# Patient Record
Sex: Male | Born: 2000
Health system: Southern US, Community
[De-identification: ages and names within clinical notes are randomized; demographics above are authoritative.]

## PROBLEM LIST (undated history)

## (undated) DIAGNOSIS — J309 Allergic rhinitis, unspecified: Secondary | ICD-10-CM

## (undated) DIAGNOSIS — U071 COVID-19: Secondary | ICD-10-CM

## (undated) DIAGNOSIS — H8110 Benign paroxysmal vertigo, unspecified ear: Secondary | ICD-10-CM

## (undated) DIAGNOSIS — S62309A Unspecified fracture of unspecified metacarpal bone, initial encounter for closed fracture: Secondary | ICD-10-CM

## (undated) DIAGNOSIS — R42 Dizziness and giddiness: Secondary | ICD-10-CM

## (undated) DIAGNOSIS — R110 Nausea: Secondary | ICD-10-CM

## (undated) DIAGNOSIS — S01511A Laceration without foreign body of lip, initial encounter: Secondary | ICD-10-CM

## (undated) DIAGNOSIS — L709 Acne, unspecified: Secondary | ICD-10-CM

## (undated) DIAGNOSIS — B360 Pityriasis versicolor: Secondary | ICD-10-CM

## (undated) HISTORY — PX: FRACTURE SURGERY: SHX138

## (undated) HISTORY — PX: TIBIA FRACTURE SURGERY: SHX806

## (undated) HISTORY — DX: Allergic rhinitis, unspecified: J30.9

## (undated) HISTORY — PX: OTHER SURGICAL HISTORY: SHX169

## (undated) HISTORY — DX: Unspecified fracture of unspecified metacarpal bone, initial encounter for closed fracture: S62.309A

## (undated) HISTORY — DX: Dizziness and giddiness: R42

## (undated) HISTORY — DX: Nausea: R11.0

## (undated) HISTORY — DX: Laceration without foreign body of lip, initial encounter: S01.511A

## (undated) HISTORY — DX: Acne, unspecified: L70.9

## (undated) HISTORY — DX: Benign paroxysmal vertigo, unspecified ear: H81.10

## (undated) HISTORY — PX: ANTERIOR CRUCIATE LIGAMENT REPAIR: SHX115

## (undated) HISTORY — DX: Pityriasis versicolor: B36.0

## (undated) HISTORY — DX: COVID-19: U07.1

---
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
## 2020-04-29 ENCOUNTER — Encounter: Payer: Self-pay | Admitting: Internal Medicine

## 2020-10-13 ENCOUNTER — Telehealth: Payer: Self-pay | Admitting: *Deleted

## 2020-10-13 NOTE — Addendum Note (Signed)
Addended by: Quentin Ore on: 10/13/2020 05:27 PM   Modules accepted: Orders

## 2020-10-13 NOTE — Telephone Encounter (Signed)
Please place future orders for lab appt.  

## 2020-10-13 NOTE — Telephone Encounter (Signed)
He is too early for labs they are not due until 11/09/20  Please sch fasting labs 11/09/20 or after date so insurance will cover thanks

## 2020-10-16 ENCOUNTER — Other Ambulatory Visit (INDEPENDENT_AMBULATORY_CARE_PROVIDER_SITE_OTHER): Payer: BC Managed Care – PPO

## 2020-10-16 ENCOUNTER — Other Ambulatory Visit: Payer: Self-pay

## 2020-10-16 DIAGNOSIS — Z0184 Encounter for antibody response examination: Secondary | ICD-10-CM | POA: Diagnosis not present

## 2020-10-16 DIAGNOSIS — E559 Vitamin D deficiency, unspecified: Secondary | ICD-10-CM | POA: Diagnosis not present

## 2020-10-16 DIAGNOSIS — Z1389 Encounter for screening for other disorder: Secondary | ICD-10-CM

## 2020-10-16 DIAGNOSIS — Z0000 Encounter for general adult medical examination without abnormal findings: Secondary | ICD-10-CM

## 2020-10-16 DIAGNOSIS — Z1329 Encounter for screening for other suspected endocrine disorder: Secondary | ICD-10-CM

## 2020-10-16 LAB — CBC WITH DIFFERENTIAL/PLATELET
Basophils Absolute: 0.1 10*3/uL (ref 0.0–0.1)
Basophils Relative: 1.3 % (ref 0.0–3.0)
Eosinophils Absolute: 0.1 10*3/uL (ref 0.0–0.7)
Eosinophils Relative: 1.2 % (ref 0.0–5.0)
HCT: 41.9 % (ref 39.0–52.0)
Hemoglobin: 14.1 g/dL (ref 13.0–17.0)
Lymphocytes Relative: 66.2 % — ABNORMAL HIGH (ref 12.0–46.0)
Lymphs Abs: 2.7 10*3/uL (ref 0.7–4.0)
MCHC: 33.5 g/dL (ref 30.0–36.0)
MCV: 86.1 fl (ref 78.0–100.0)
Monocytes Absolute: 0.3 10*3/uL (ref 0.1–1.0)
Monocytes Relative: 6.3 % (ref 3.0–12.0)
Neutro Abs: 1 10*3/uL — ABNORMAL LOW (ref 1.4–7.7)
Neutrophils Relative %: 25 % — ABNORMAL LOW (ref 43.0–77.0)
Platelets: 230 10*3/uL (ref 150.0–400.0)
RBC: 4.87 Mil/uL (ref 4.22–5.81)
RDW: 13.2 % (ref 11.5–14.6)
WBC: 4.1 10*3/uL — ABNORMAL LOW (ref 4.5–10.5)

## 2020-10-16 LAB — TSH: TSH: 2.86 u[IU]/mL (ref 0.35–5.50)

## 2020-10-16 LAB — VITAMIN D 25 HYDROXY (VIT D DEFICIENCY, FRACTURES): VITD: 21.3 ng/mL — ABNORMAL LOW (ref 30.00–100.00)

## 2020-10-16 NOTE — Telephone Encounter (Signed)
This is why we need to have labs on day of appt with me to be sure of this or ensure patients are not calling and scheduling their own labs not sure if insurance will cover though I hope they do see prior message please

## 2020-10-16 NOTE — Telephone Encounter (Signed)
Noted labs were last done 11/2019. Since has been over 6 months labs should be covered per the lab

## 2020-10-16 NOTE — Telephone Encounter (Signed)
Pt came in this morning. Labs were drawn

## 2020-10-16 NOTE — Telephone Encounter (Signed)
No answer, no voicemail.   Patient needs to cancel lab appointment today and reschedule on or after 11/09/20

## 2020-10-17 ENCOUNTER — Encounter: Payer: Self-pay | Admitting: Internal Medicine

## 2020-10-17 DIAGNOSIS — D72819 Decreased white blood cell count, unspecified: Secondary | ICD-10-CM | POA: Insufficient documentation

## 2020-10-17 DIAGNOSIS — E559 Vitamin D deficiency, unspecified: Secondary | ICD-10-CM | POA: Insufficient documentation

## 2020-10-17 LAB — HEPATITIS B SURFACE ANTIBODY, QUANTITATIVE: Hep B S AB Quant (Post): 309 m[IU]/mL (ref >=10)

## 2020-10-17 LAB — URINALYSIS, ROUTINE W REFLEX MICROSCOPIC
Bilirubin Urine: NEGATIVE
Glucose, UA: NEGATIVE
Hgb urine dipstick: NEGATIVE
Ketones, ur: NEGATIVE
Leukocytes,Ua: NEGATIVE
Nitrite: NEGATIVE
Protein, ur: NEGATIVE
Specific Gravity, Urine: 1.026 (ref 1.001–1.035)
pH: 5.5 (ref 5.0–8.0)

## 2020-10-17 LAB — COMPREHENSIVE METABOLIC PANEL
ALT: 11 U/L (ref 0–53)
AST: 14 U/L (ref 0–37)
Albumin: 4.7 g/dL (ref 3.5–5.2)
Alkaline Phosphatase: 57 U/L (ref 39–117)
BUN: 16 mg/dL (ref 6–23)
CO2: 30 mEq/L (ref 19–32)
Calcium: 10.2 mg/dL (ref 8.4–10.5)
Chloride: 102 mEq/L (ref 96–112)
Creatinine, Ser: 1.19 mg/dL (ref 0.40–1.50)
GFR: 87.98 mL/min (ref >60.00)
Glucose, Bld: 87 mg/dL (ref 70–99)
Potassium: 4 mEq/L (ref 3.5–5.1)
Sodium: 141 mEq/L (ref 135–145)
Total Bilirubin: 0.9 mg/dL (ref 0.2–1.2)
Total Protein: 7.5 g/dL (ref 6.0–8.3)

## 2020-10-19 ENCOUNTER — Telehealth: Payer: Self-pay

## 2020-10-19 NOTE — Telephone Encounter (Signed)
Called to give labs. Unable to leave message

## 2020-10-20 ENCOUNTER — Ambulatory Visit (INDEPENDENT_AMBULATORY_CARE_PROVIDER_SITE_OTHER): Payer: BC Managed Care – PPO | Admitting: Internal Medicine

## 2020-10-20 ENCOUNTER — Encounter: Payer: Self-pay | Admitting: Internal Medicine

## 2020-10-20 ENCOUNTER — Other Ambulatory Visit: Payer: Self-pay

## 2020-10-20 VITALS — BP 122/78 | HR 54 | Temp 97.8°F | Resp 16 | Ht 71.0 in | Wt 171.8 lb

## 2020-10-20 DIAGNOSIS — B360 Pityriasis versicolor: Secondary | ICD-10-CM

## 2020-10-20 DIAGNOSIS — Z0000 Encounter for general adult medical examination without abnormal findings: Secondary | ICD-10-CM | POA: Diagnosis not present

## 2020-10-20 DIAGNOSIS — D72829 Elevated white blood cell count, unspecified: Secondary | ICD-10-CM

## 2020-10-20 MED ORDER — KETOCONAZOLE 2 % EX SHAM: 1.0000 "application " | MEDICATED_SHAMPOO | CUTANEOUS | 11 refills | Status: DC

## 2020-10-20 MED ORDER — KETOCONAZOLE 2 % EX CREA
1.0000 | TOPICAL_CREAM | Freq: Two times a day (BID) | CUTANEOUS | 11 refills | Status: DC | PRN
Start: 2020-10-20 — End: 2021-11-28

## 2020-10-20 NOTE — Patient Instructions (Addendum)
Results for LIEM, COPENHAVER (MRN [id number]) as of 10/20/2020 13:28  Ref. Range 10/16/2020 08:10  Hepatitis B-Post Latest Ref Range: > OR = 10 mIU/mL 309    Multivitamin with vitamin D and also take additional vitamin D3 2000 IU daily   Check again CBC in 1 month   Tdap vaccine due 12/18/2021 not yet but almost    Vitamin D Deficiency Vitamin D deficiency is when your body does not have enough vitamin D. Vitamin D is important to your body for many reasons: It helps the body absorb two important minerals--calcium and phosphorus. It plays a role in bone health. It may help to prevent some diseases, such as diabetes and multiple sclerosis. It plays a role in muscle function, including heart function. If vitamin D deficiency is severe, it can cause a condition in which your bones become soft. In adults, this condition is called osteomalacia. In children,this condition is called rickets. What are the causes? This condition may be caused by: Not eating enough foods that contain vitamin D. Not getting enough natural sun exposure. Having certain digestive system diseases that make it difficult for your body to absorb vitamin D. These diseases include Crohn's disease, chronic pancreatitis, and cystic fibrosis. Having a surgery in which a part of the stomach or a part of the small intestine is removed. Having chronic kidney disease or liver disease. What increases the risk? You are more likely to develop this condition if you: Are older. Do not spend much time outdoors. Live in a long-term care facility. Have had broken bones. Have weak or thin bones (osteoporosis). Have a disease or condition that changes how the body absorbs vitamin D. Have dark skin. Take certain medicines, such as steroid medicines or certain seizure medicines. Are overweight or obese. What are the signs or symptoms? In mild cases of vitamin D deficiency, there may not be any symptoms. If the condition is severe, symptoms  may include: Bone pain. Muscle pain. Falling often. Broken bones caused by a minor injury. How is this diagnosed? This condition may be diagnosed with blood tests. Imaging tests such as [place] also be done to look for changes in the bone. How is this treated? Treatment for this condition may depend on what caused the condition. Treatment options include: Taking vitamin D supplements. Your health care provider will suggest what dose is best for you. Taking a calcium supplement. Your health care provider will suggest what dose is best for you. Follow these instructions at home: Eating and drinking  Eat foods that contain vitamin D. Choices include: Fortified dairy products, cereals, or juices. Fortified means that vitamin D has been added to the food. Check the label on the package to see if the food is fortified. Fatty fish, such as salmon or trout. Eggs. Oysters. Mushrooms. The items listed above may not be a complete list of recommended foods and beverages. Contact a dietitian for more information. General instructions Take medicines and supplements only as told by your health care provider. Get regular, safe exposure to natural sunlight. Do not use a tanning bed. Maintain a healthy weight. Lose weight if needed. Keep all follow-up visits as told by your health care provider. This is important. How is this prevented? You can get vitamin D by: Eating foods that naturally contain vitamin D. Eating or drinking products that have been fortified with vitamin D, such as cereals, juices, and dairy products (including milk). Taking a vitamin D supplement or a multivitamin supplement that contains vitamin  D. Being in the sun. Your body naturally makes vitamin D when your skin is exposed to sunlight. Your body changes the sunlight into a form of the vitamin that it can use. Contact a health care provider if: Your symptoms do not go away. You feel nauseous or you vomit. You have fewer  bowel movements than usual or are constipated. Summary Vitamin D deficiency is when your body does not have enough vitamin D. Vitamin D is important to your body for good bone health and muscle function, and it may help prevent some diseases. Vitamin D deficiency is primarily treated through supplementation. Your health care provider will suggest what dose is best for you. You can get vitamin D by eating foods that contain vitamin D, by being in the sun, and by taking a vitamin D supplement or a multivitamin supplement that contains vitamin D. This information is not intended to replace advice given to you by your health care provider. Make sure you discuss any questions you have with your healthcare provider. Document Revised: 01/05/2018 Document Reviewed: 01/05/2018 Elsevier Patient Education  2022 [employer].

## 2020-10-20 NOTE — Progress Notes (Signed)
Chief Complaint  Patient presents with   Follow-up   Annual  Doing well reviewed labs vit d def and CBC WBC 4.1 and lymphocytosis 66 will recheck in 1 month feeling well    Review of Systems  Constitutional:  Negative for weight loss.  HENT:  Negative for hearing loss.   Eyes:  Negative for blurred vision.  Respiratory:  Negative for shortness of breath.   Cardiovascular:  Negative for chest pain.  Gastrointestinal:  Negative for abdominal pain.  Musculoskeletal:  Negative for falls and joint pain.  Skin:  Negative for rash.  Neurological:  Negative for headaches.  Psychiatric/Behavioral:  Negative for depression.   Past Medical History:  Diagnosis Date   Acne    used retina 0.05 cream in the past    Allergic rhinitis    does not take meds for this   BPPV (benign paroxysmal positional vertigo)    COVID-19    fall 2020 no sx's except nausea   Laceration of upper frenulum    per Dr. Barbera Setters peds notes   Metacarpal bone fracture    right 1st   Nausea    Tinea versicolor    Vertigo    2016/2017 with fast movements and sinus issues related   Past Surgical History:  Procedure Laterality Date   ANTERIOR CRUCIATE LIGAMENT REPAIR     right knee 2019   FRACTURE SURGERY     right meniscus     2021   TIBIA FRACTURE SURGERY Right    2015    Family History  Problem Relation Age of Onset   Hypertension Mother    Kidney Stones Mother    Migraines Mother    Vasculitis Mother        anca    Diabetes Mother        PREDIABETEs   GER disease Mother    Anxiety disorder Father    Depression Father    Anxiety disorder Brother    Supraventricular tachycardia Brother    Sleep apnea Brother    Migraines Brother    Kidney Stones Brother    Social History   Socioeconomic History   Marital status: Single    Spouse name: Not on file   Number of children: Not on file   Years of education: Not on file   Highest education level: Not on file  Occupational History   Not on file   Tobacco Use   Smoking status: Never   Smokeless tobacco: Never  Substance and Sexual Activity   Alcohol use: No   Drug use: No   Sexual activity: Not on file  Other Topics Concern   Not on file  Social History Narrative   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   No Known Allergies Recent Results (from the past 2160 hour(s))  Vitamin D (25 hydroxy)     Status: Abnormal  Collection Time: 10/16/20  8:10 AM  Result Value Ref Range   VITD 21.30 (L) 30.00 - 100.00 ng/mL  Hepatitis B surface antibody,quantitative     Status: None   Collection Time: 10/16/20  8:10 AM  Result Value Ref Range   Hepatitis B-Post 309 > OR = 10 mIU/mL    Comment: . Patient has immunity to hepatitis B virus. . For additional information, please refer to http://education.questdiagnostics.com/faq/FAQ105 (This link is being provided for informational/ educational purposes only).   TSH     Status: None   Collection Time: 10/16/20  8:10 AM  Result Value Ref Range   TSH 2.86 0.35 - 5.50 uIU/mL  CBC with Differential/Platelet     Status: Abnormal   Collection Time: 10/16/20  8:10 AM  Result Value Ref Range   WBC 4.1 (L) 4.5 - 10.5 K/uL   RBC 4.87 4.22 - 5.81 Mil/uL   Hemoglobin 14.1 13.0 - 17.0 g/dL   HCT 41.9 39.0 - 52.0 %   MCV 86.1 78.0 - 100.0 fl   MCHC 33.5 30.0 - 36.0 g/dL   RDW 13.2 11.5 - 14.6 %   Platelets 230.0 150.0 - 400.0  K/uL   Neutrophils Relative % 25.0 (L) 43.0 - 77.0 %   Lymphocytes Relative 66.2 Repeated and verified X2. (H) 12.0 - 46.0 %   Monocytes Relative 6.3 3.0 - 12.0 %   Eosinophils Relative 1.2 0.0 - 5.0 %   Basophils Relative 1.3 0.0 - 3.0 %   Neutro Abs 1.0 (L) 1.4 - 7.7 K/uL   Lymphs Abs 2.7 0.7 - 4.0 K/uL   Monocytes Absolute 0.3 0.1 - 1.0 K/uL   Eosinophils Absolute 0.1 0.0 - 0.7 K/uL   Basophils Absolute 0.1 0.0 - 0.1 K/uL  Comprehensive metabolic panel     Status: None   Collection Time: 10/16/20  8:10 AM  Result Value Ref Range   Sodium 141 135 - 145 mEq/L   Potassium 4.0 3.5 - 5.1 mEq/L   Chloride 102 96 - 112 mEq/L   CO2 30 19 - 32 mEq/L   Glucose, Bld 87 70 - 99 mg/dL   BUN 16 6 - 23 mg/dL   Creatinine, Ser 1.19 0.40 - 1.50 mg/dL   Total Bilirubin 0.9 0.2 - 1.2 mg/dL   Alkaline Phosphatase 57 39 - 117 U/L   AST 14 0 - 37 U/L   ALT 11 0 - 53 U/L   Total Protein 7.5 6.0 - 8.3 g/dL   Albumin 4.7 3.5 - 5.2 g/dL   GFR 87.98 >60.00 mL/min    Comment: Calculated using the CKD-EPI Creatinine Equation (2021)   Calcium 10.2 8.4 - 10.5 mg/dL  Urinalysis, Routine w reflex microscopic     Status: None   Collection Time: 10/16/20  8:11 AM  Result Value Ref Range   Color, Urine YELLOW YELLOW   APPearance CLEAR CLEAR   Specific Gravity, Urine 1.026 1.001 - 1.035   pH 5.5 5.0 - 8.0   Glucose, UA NEGATIVE NEGATIVE   Bilirubin Urine NEGATIVE NEGATIVE   Ketones, ur NEGATIVE NEGATIVE   Hgb urine dipstick NEGATIVE NEGATIVE   Protein, ur NEGATIVE NEGATIVE   Nitrite NEGATIVE NEGATIVE   Leukocytes,Ua NEGATIVE NEGATIVE   Objective  Body mass index is 23.79 kg/m. Wt Readings from Last 3 Encounters:  10/20/20 171 lb 12.8 oz (77.9 kg)  10/20/19 172 lb 12.8 oz (78.4 kg) (75 %, Z= 0.68)*  10/26/17 170 lb (77.1 kg) (81 %, Z= 0.89)*   *  Growth percentiles are based on CDC (Boys, 2-20 Years) data.   Temp Readings from Last 3 Encounters:  10/20/20 97.8 F (36.6 C)  10/20/19 (!) 97.5 F  (36.4 C) (Temporal)  10/26/17 97.8 F (36.6 C) (Oral)   BP Readings from Last 3 Encounters:  10/20/20 122/78  10/20/19 106/80  10/26/17 (!) 140/92   Pulse Readings from Last 3 Encounters:  10/20/20 (!) 54  10/20/19 61  10/26/17 78    Physical Exam Vitals and nursing note reviewed.  Constitutional:      Appearance: Normal appearance. He is well-developed and well-groomed.  HENT:     Head: Normocephalic and atraumatic.  Eyes:     Conjunctiva/sclera: Conjunctivae normal.     Pupils: Pupils are equal, round, and reactive to light.  Cardiovascular:     Rate and Rhythm: Normal rate and regular rhythm.     Heart sounds: Normal heart sounds. No murmur heard. Abdominal:     General: Abdomen is flat. Bowel sounds are normal.  Skin:    General: Skin is warm and moist.  Neurological:     General: No focal deficit present.     Mental Status: He is alert and oriented to person, place, and time.     Gait: Gait normal.  Psychiatric:        Attention and Perception: Attention and perception normal.        Mood and Affect: Mood and affect normal.        Speech: Speech normal.        Behavior: Behavior normal. Behavior is cooperative.        Thought Content: Thought content normal.        Cognition and Memory: Cognition normal.    Assessment  Plan  Annual physical exam Flu shot 02/2018 ? If had  Tdap 12/19/11 HPV had 3/3 Had prevnar 13 moderna 2/2  consider 3rd and 5th doses had covid 2020 Hep B immune  MMR immune  TB negative 11/10/19  nonFasting labs (decide if wants hep B,MMRV mom to check titers at home) rec healthy diet and exercise avoid cigs/drugs    Tinea versicolor - Plan: ketoconazole (NIZORAL) 2 % shampoo, ketoconazole (NIZORAL) 2 % cream    Former meds Dr. Barbera Setters [place]   Leukocytosis, unspecified type - Plan: CBC with Differential/Platelet, Pathologist smear review  Tinea versicolor - Plan: ketoconazole (NIZORAL) 2 % shampoo, ketoconazole (NIZORAL) 2 % cream     Provider: Dr. Olivia Mackie McLean-Scocuzza-Internal Medicine

## 2020-11-20 ENCOUNTER — Other Ambulatory Visit: Payer: Self-pay

## 2020-11-20 ENCOUNTER — Other Ambulatory Visit (INDEPENDENT_AMBULATORY_CARE_PROVIDER_SITE_OTHER): Payer: BC Managed Care – PPO

## 2020-11-20 DIAGNOSIS — D72829 Elevated white blood cell count, unspecified: Secondary | ICD-10-CM

## 2020-11-21 LAB — CBC WITH DIFFERENTIAL/PLATELET
Absolute Monocytes: 420 cells/uL (ref 200–950)
Basophils Absolute: 72 cells/uL (ref 0–200)
Basophils Relative: 1.2 %
Eosinophils Absolute: 72 cells/uL (ref 15–500)
Eosinophils Relative: 1.2 %
HCT: 41 % (ref 38.5–50.0)
Hemoglobin: 13.6 g/dL (ref 13.2–17.1)
Lymphs Abs: 3810 cells/uL (ref 850–3900)
MCH: 29.1 pg (ref 27.0–33.0)
MCHC: 33.2 g/dL (ref 32.0–36.0)
MCV: 87.6 fL (ref 80.0–100.0)
MPV: 10.3 fL (ref 7.5–12.5)
Monocytes Relative: 7 %
Neutro Abs: 1626 cells/uL (ref 1500–7800)
Neutrophils Relative %: 27.1 %
Platelets: 238 10*3/uL (ref 140–400)
RBC: 4.68 10*6/uL (ref 4.20–5.80)
RDW: 13.5 % (ref 11.0–15.0)
Total Lymphocyte: 63.5 %
WBC: 6 10*3/uL (ref 3.8–10.8)

## 2020-11-21 LAB — PATHOLOGIST SMEAR REVIEW

## 2021-01-01 ENCOUNTER — Encounter: Payer: Self-pay | Admitting: Internal Medicine

## 2021-10-23 ENCOUNTER — Encounter: Payer: BC Managed Care – PPO | Admitting: Internal Medicine

## 2021-11-12 ENCOUNTER — Other Ambulatory Visit: Payer: Self-pay | Admitting: Internal Medicine

## 2021-11-12 DIAGNOSIS — B360 Pityriasis versicolor: Secondary | ICD-10-CM

## 2021-11-12 DIAGNOSIS — H1132 Conjunctival hemorrhage, left eye: Secondary | ICD-10-CM | POA: Diagnosis not present

## 2021-11-22 ENCOUNTER — Encounter: Payer: Self-pay | Admitting: Internal Medicine

## 2021-11-28 ENCOUNTER — Encounter: Payer: Self-pay | Admitting: Internal Medicine

## 2021-11-28 ENCOUNTER — Ambulatory Visit (INDEPENDENT_AMBULATORY_CARE_PROVIDER_SITE_OTHER): Payer: Self-pay | Admitting: Internal Medicine

## 2021-11-28 VITALS — BP 118/60 | HR 56 | Temp 98.1°F | Ht 71.0 in | Wt 168.6 lb

## 2021-11-28 DIAGNOSIS — E559 Vitamin D deficiency, unspecified: Secondary | ICD-10-CM

## 2021-11-28 DIAGNOSIS — Z23 Encounter for immunization: Secondary | ICD-10-CM

## 2021-11-28 DIAGNOSIS — Z13220 Encounter for screening for lipoid disorders: Secondary | ICD-10-CM

## 2021-11-28 DIAGNOSIS — Z1389 Encounter for screening for other disorder: Secondary | ICD-10-CM

## 2021-11-28 DIAGNOSIS — D72820 Lymphocytosis (symptomatic): Secondary | ICD-10-CM

## 2021-11-28 DIAGNOSIS — Z1329 Encounter for screening for other suspected endocrine disorder: Secondary | ICD-10-CM

## 2021-11-28 DIAGNOSIS — Z0000 Encounter for general adult medical examination without abnormal findings: Secondary | ICD-10-CM

## 2021-11-28 DIAGNOSIS — B360 Pityriasis versicolor: Secondary | ICD-10-CM

## 2021-11-28 DIAGNOSIS — L309 Dermatitis, unspecified: Secondary | ICD-10-CM

## 2021-11-28 LAB — COMPREHENSIVE METABOLIC PANEL
ALT: 15 U/L (ref 0–53)
AST: 21 U/L (ref 0–37)
Albumin: 4.9 g/dL (ref 3.5–5.2)
Alkaline Phosphatase: 57 U/L (ref 39–117)
BUN: 25 mg/dL — ABNORMAL HIGH (ref 6–23)
CO2: 31 mEq/L (ref 19–32)
Calcium: 10 mg/dL (ref 8.4–10.5)
Chloride: 102 mEq/L (ref 96–112)
Creatinine, Ser: 1.23 mg/dL (ref 0.40–1.50)
GFR: 83.9 mL/min (ref >60.00)
Glucose, Bld: 83 mg/dL (ref 70–99)
Potassium: 3.9 mEq/L (ref 3.5–5.1)
Sodium: 141 mEq/L (ref 135–145)
Total Bilirubin: 1.5 mg/dL — ABNORMAL HIGH (ref 0.2–1.2)
Total Protein: 7.9 g/dL (ref 6.0–8.3)

## 2021-11-28 LAB — CBC WITH DIFFERENTIAL/PLATELET
Basophils Absolute: 0.1 10*3/uL (ref 0.0–0.1)
Basophils Relative: 1.8 % (ref 0.0–3.0)
Eosinophils Absolute: 0.1 10*3/uL (ref 0.0–0.7)
Eosinophils Relative: 1.8 % (ref 0.0–5.0)
HCT: 41.5 % (ref 39.0–52.0)
Hemoglobin: 14 g/dL (ref 13.0–17.0)
Lymphocytes Relative: 53.9 % — ABNORMAL HIGH (ref 12.0–46.0)
Lymphs Abs: 2.3 10*3/uL (ref 0.7–4.0)
MCHC: 33.7 g/dL (ref 30.0–36.0)
MCV: 86.7 fl (ref 78.0–100.0)
Monocytes Absolute: 0.4 10*3/uL (ref 0.1–1.0)
Monocytes Relative: 9.4 % (ref 3.0–12.0)
Neutro Abs: 1.4 10*3/uL (ref 1.4–7.7)
Neutrophils Relative %: 33.1 % — ABNORMAL LOW (ref 43.0–77.0)
Platelets: 233 10*3/uL (ref 150.0–400.0)
RBC: 4.78 Mil/uL (ref 4.22–5.81)
RDW: 13.2 % (ref 11.5–15.5)
WBC: 4.2 10*3/uL (ref 4.0–10.5)

## 2021-11-28 LAB — LIPID PANEL
Cholesterol: 145 mg/dL (ref 0–200)
HDL: 45.4 mg/dL (ref >39.00)
LDL Cholesterol: 88 mg/dL (ref 0–99)
NonHDL: 99.91
Total CHOL/HDL Ratio: 3
Triglycerides: 61 mg/dL (ref 0.0–149.0)
VLDL: 12.2 mg/dL (ref 0.0–40.0)

## 2021-11-28 LAB — VITAMIN D 25 HYDROXY (VIT D DEFICIENCY, FRACTURES): VITD: 41.4 ng/mL (ref 30.00–100.00)

## 2021-11-28 LAB — TSH: TSH: 1.87 u[IU]/mL (ref 0.35–5.50)

## 2021-11-28 MED ORDER — TETANUS-DIPHTH-ACELL PERTUSSIS 5-2.5-18.5 LF-MCG/0.5 IM SUSP: 0.5000 mL | Freq: Once | INTRAMUSCULAR | 0 refills | Status: AC

## 2021-11-28 MED ORDER — KETOCONAZOLE 2 % EX CREA: 1.0000 | TOPICAL_CREAM | Freq: Two times a day (BID) | CUTANEOUS | 11 refills | Status: AC | PRN

## 2021-11-28 MED ORDER — KETOCONAZOLE 2 % EX SHAM: MEDICATED_SHAMPOO | CUTANEOUS | 11 refills | Status: AC

## 2021-11-28 MED ORDER — CLOBETASOL PROPIONATE 0.05 % EX CREA: 1.0000 | TOPICAL_CREAM | Freq: Two times a day (BID) | CUTANEOUS | 5 refills | Status: AC

## 2021-11-28 NOTE — Progress Notes (Signed)
Chief Complaint  Patient presents with   Annual Exam    PE with no other concerns   Annual  Doing well except rash with itching b/l elbow itchy recently using dial bodywash rec dove     Review of Systems  Constitutional:  Negative for weight loss.  HENT:  Negative for hearing loss.   Eyes:  Negative for blurred vision.  Respiratory:  Negative for shortness of breath.   Cardiovascular:  Negative for chest pain.  Gastrointestinal:  Negative for abdominal pain and blood in stool.  Musculoskeletal:  Negative for back pain.  Skin:  Negative for rash.  Neurological:  Negative for headaches.  Psychiatric/Behavioral:  Negative for depression.    Past Medical History:  Diagnosis Date   Acne    used retina 0.05 cream in the past    Allergic rhinitis    does not take meds for this   BPPV (benign paroxysmal positional vertigo)    COVID-19    fall 2020 no sx's except nausea   Laceration of upper frenulum    per Dr. Barbera Setters peds notes   Metacarpal bone fracture    right 1st   Nausea    Tinea versicolor    Vertigo    2016/2017 with fast movements and sinus issues related   Past Surgical History:  Procedure Laterality Date   ANTERIOR CRUCIATE LIGAMENT REPAIR     right knee 2019   FRACTURE SURGERY     right meniscus     2021   TIBIA FRACTURE SURGERY Right    2015    Family History  Problem Relation Age of Onset   Hypertension Mother    Kidney Stones Mother    Migraines Mother    Vasculitis Mother        anca    Diabetes Mother        PREDIABETEs   GER disease Mother    Anxiety disorder Father    Depression Father    Anxiety disorder Brother    Supraventricular tachycardia Brother    Sleep apnea Brother    Migraines Brother    Kidney Stones Brother    Social History   Socioeconomic History   Marital status: Single    Spouse name: Not on file   Number of children: Not on file   Years of education: Not on file   Highest education level: Not on file   Occupational History   Not on file  Tobacco Use   Smoking status: Never   Smokeless tobacco: Never  Substance and Sexual Activity   Alcohol use: No   Drug use: No   Sexual activity: Not on file  Other Topics Concern   Not on file  Social History Narrative   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   [START ON  12/18/2021] Tdap (BOOSTRIX) 5-2.5-18.5 LF-MCG/0.5 injection Inject 0.5 mLs into the muscle once for 1 dose.   [DISCONTINUED] ketoconazole (NIZORAL) 2 % cream Apply 1 application topically 2 (two) times daily as needed for irritation. Back as needed   [DISCONTINUED] ketoconazole (NIZORAL) 2 % shampoo APPLY 1 APPLICATION TOPICALLY 2 (TWO) TIMES A WEEK. LET SIT X 5 MINUTES THEN RINSE OFF   No Known Allergies No results found for this or any previous visit (from the past 2160 hour(s)). Objective  Body mass index is 23.51 kg/m. Wt Readings from Last 3 Encounters:  11/28/21 168 lb 9.6 oz (76.5 kg)  10/20/20 171 lb 12.8 oz (77.9 kg)  10/20/19 172 lb 12.8 oz (78.4 kg) (75 %, Z= 0.68)*   * Growth percentiles are based on CDC (Boys, 2-20 Years) data.   Temp Readings from Last 3 Encounters:  11/28/21 98.1 F (36.7 C) (Oral)  10/20/20 97.8 F (36.6 C)  10/20/19 (!) 97.5 F (36.4 C) (Temporal)   BP Readings from Last 3 Encounters:  11/28/21 118/60  10/20/20 122/78  10/20/19 106/80    Pulse Readings from Last 3 Encounters:  11/28/21 (!) 56  10/20/20 (!) 54  10/20/19 61    Physical Exam Vitals and nursing note reviewed.  Constitutional:      Appearance: Normal appearance. He is well-developed and well-groomed.  HENT:     Head: Normocephalic and atraumatic.  Eyes:     Conjunctiva/sclera: Conjunctivae normal.     Pupils: Pupils are equal, round, and reactive to light.  Cardiovascular:     Rate and Rhythm: Regular rhythm. Bradycardia present.     Heart sounds: Normal heart sounds.  Pulmonary:     Effort: Pulmonary effort is normal. No respiratory distress.     Breath sounds: Normal breath sounds.  Abdominal:     Tenderness: There is no abdominal tenderness.  Skin:    General: Skin is warm and moist.       Neurological:     General: No focal deficit present.     Mental Status: He is alert and oriented to person, place, and time. Mental status is at baseline.     Sensory: Sensation is intact.     Motor: Motor function is intact.     Coordination: Coordination is intact.     Gait: Gait is intact. Gait normal.  Psychiatric:        Attention and Perception: Attention and perception normal.        Mood and Affect: Mood and affect normal.        Speech: Speech normal.        Behavior: Behavior normal. Behavior is cooperative.        Thought Content: Thought content normal.        Cognition and Memory: Cognition and memory normal.        Judgment: Judgment normal.     Assessment  Plan  Annual physical exam - Plan: Comprehensive metabolic panel, Lipid panel, CBC with Differential/Platelet  See below   Tinea versicolor - Plan: ketoconazole (NIZORAL) 2 % shampoo, ketoconazole (NIZORAL) 2 % cream  Eczema, elbows- Plan: clobetasol cream (TEMOVATE) 0.05 %   Flu shot 02/2018 not had since Tdap 12/19/11 rx since HPV had 3/3 Had prevnar 13 moderna 2/2  consider 3rd and 5th doses had covid 2020 Hep B immune  MMR immune  TB negative 11/10/19    rec  healthy diet and exercise avoid cigs/drugs   Provider: Dr. Olivia Mackie McLean-Scocuzza-Internal Medicine

## 2021-11-28 NOTE — Patient Instructions (Addendum)
Switch to dove bodywash   Tdap due 12/18/21   Eczema Eczema refers to a group of skin conditions that cause skin to become rough and inflamed. Each type of eczema has different triggers, symptoms, and treatments. Eczema of any type is usually itchy. Symptoms range from mild to severe. Eczema is not spread from person to person (is not contagious). It can appear on different parts of the body at different times. One person's eczema may look different from another person's eczema. What are the causes? The exact cause of this condition is not known. However, exposure to certain environmental factors, irritants, and allergens can make the condition worse. What are the signs or symptoms? Symptoms of this condition depend on the type of eczema you have. The types include: Contact dermatitis. There are two kinds: Irritant contact dermatitis. This happens when something irritates the skin and causes a rash. Allergic contact dermatitis. This happens when your skin comes in contact with something you are allergic to (allergens). This can include poison ivy, chemicals, or medicines that were applied to your skin. Atopic dermatitis. This is a long-term (chronic) skin disease that keeps coming back (recurring). It is the most common type of eczema. Usual symptoms are a red rash and itchy, dry, scaly skin. It usually starts showing signs in infancy and can last through adulthood. Dyshidrotic eczema. This is a form of eczema on the hands and feet. It shows up as very itchy, fluid-filled blisters. It can affect people of any age but is more common before age 50. Hand eczema. This causes very itchy areas of skin on the palms and sides of the hands and fingers. This type of eczema is common in industrial jobs where you may be exposed to different types of irritants. Lichen simplex chronicus. This type of eczema occurs when a person constantly scratches one area of the body. Repeated scratching of the area leads to  thickened skin (lichenification). This condition can accompany other types of eczema. It is more common in adults but may also be seen in children. Nummular eczema. This is a common type of eczema that most often affects the lower legs and the backs of the hands. It typically causes an itchy, red, circular, crusty lesion (plaque). Scratching may become a habit and can cause bleeding. Nummular eczema occurs most often in middle-aged or older people. Seborrheic dermatitis. This is a common skin disease that mainly affects the scalp. It may also affect other oily areas of the body, such as the face, sides of the nose, eyebrows, ears, eyelids, and chest. It is marked by small scaling and redness of the skin (erythema). This can affect people of all ages. In infants, this condition is called cradle cap. Stasis dermatitis. This is a common skin disease that can cause itching, scaling, and hyperpigmentation, usually on the legs and feet. It occurs most often in people who have a condition that prevents blood from being pumped through the veins in the legs (chronic venous insufficiency). Stasis dermatitis is a chronic condition that needs long-term management. How is this diagnosed? This condition may be diagnosed based on: A physical exam of your skin. Your medical history. Skin patch tests. These tests involve using patches that contain possible allergens and placing them on your back. Your health care provider will check in a few days to see if an allergic reaction occurred. How is this treated? Treatment for eczema is based on the type of eczema you have. You may be given hydrocortisone steroid medicine  or antihistamines. These can relieve itching quickly and help reduce inflammation. These may be prescribed or purchased over the counter, depending on the strength that is needed. Follow these instructions at home: Take or apply over-the-counter and prescription medicines only as told by your health care  provider. Use creams or ointments to moisturize your skin. Do not use lotions. Learn what triggers or irritates your symptoms so you can avoid these things. Treat symptom flare-ups quickly. Do not scratch your skin. This can make your rash worse. Keep all follow-up visits. This is important. Where to find more information American Academy of Dermatology: MarketingSheets.si National Eczema Association: nationaleczema.org The Society for Pediatric Dermatology: pedsderm.net Contact a health care provider if: You have severe itching, even with treatment. You scratch your skin regularly until it bleeds. Your rash looks different than usual. Your skin is painful, swollen, or more red than usual. You have a fever. Summary Eczema refers to a group of skin conditions that cause skin to become rough and inflamed. Each type has different triggers. Eczema of any type causes itching that may range from mild to severe. Treatment varies based on the type of eczema you have. Hydrocortisone steroid medicine or antihistamines can help with itching and inflammation. Protecting your skin is the best way to prevent eczema. Use creams or ointments to moisturize your skin. Avoid triggers and irritants. Treat flare-ups quickly. This information is not intended to replace advice given to you by your health care provider. Make sure you discuss any questions you have with your health care provider. Document Revised: 02/07/2020 Document Reviewed: 02/07/2020 Elsevier Patient Education  2023 [employer].

## 2021-11-29 LAB — URINALYSIS, ROUTINE W REFLEX MICROSCOPIC
Bilirubin Urine: NEGATIVE
Glucose, UA: NEGATIVE
Hgb urine dipstick: NEGATIVE
Ketones, ur: NEGATIVE
Leukocytes,Ua: NEGATIVE
Nitrite: NEGATIVE
Protein, ur: NEGATIVE
Specific Gravity, Urine: 1.036 — ABNORMAL HIGH (ref 1.001–1.035)
pH: 5.5 (ref 5.0–8.0)

## 2021-12-03 DIAGNOSIS — D72820 Lymphocytosis (symptomatic): Secondary | ICD-10-CM | POA: Insufficient documentation

## 2021-12-03 NOTE — Addendum Note (Signed)
Addended by: Quentin Ore on: 12/03/2021 09:09 PM   Modules accepted: Orders

## 2021-12-18 ENCOUNTER — Encounter: Payer: Self-pay | Admitting: Internal Medicine

## 2021-12-20 ENCOUNTER — Telehealth: Payer: Self-pay

## 2021-12-20 NOTE — Telephone Encounter (Signed)
Worthy Rancher B, FNP  You 16 hours ago (3:38 PM)    Will you call and clarify this message please?    You routed conversation to McLean-Scocuzza, [name], MD 23 hours ago (8:42 AM)   Luther Bradley  P [facility] (supporting [name] McLean-Scocuzza, MD) 2 days ago    no one has contacted you about the referral. My mom wants to see if i can get labs repeated at [facility] because i leave for school Aug 15, just to recheck values since you had been sick prior to that appointment    Called pt in regards to MyChart msg so he can clarify. He did not answer and his mailbox was full so couldn't leave a msg.

## 2022-03-06 ENCOUNTER — Encounter: Payer: Self-pay | Admitting: Family Medicine

## 2022-12-02 ENCOUNTER — Encounter: Payer: Self-pay | Admitting: Family Medicine

## 2022-12-25 ENCOUNTER — Encounter: Payer: Self-pay | Admitting: Family Medicine

## 2023-04-21 DIAGNOSIS — S86112A Strain of other muscle(s) and tendon(s) of posterior muscle group at lower leg level, left leg, initial encounter: Secondary | ICD-10-CM | POA: Diagnosis not present

## 2023-04-21 DIAGNOSIS — M79672 Pain in left foot: Secondary | ICD-10-CM | POA: Diagnosis not present
